# Patient Record
Sex: Female | Born: 2000 | Race: Black or African American | Hispanic: No | Marital: Single | Smoking: Never smoker
Health system: Southern US, Community
[De-identification: ages and names within clinical notes are randomized; demographics above are authoritative.]

---

## 2020-10-04 ENCOUNTER — Encounter (HOSPITAL_COMMUNITY): Payer: Self-pay | Admitting: Emergency Medicine

## 2020-10-04 ENCOUNTER — Emergency Department (HOSPITAL_COMMUNITY)
Admission: EM | Admit: 2020-10-04 | Discharge: 2020-10-04 | Disposition: A | Discharge status: Home / Self Care | Payer: Medicaid Other | Attending: Emergency Medicine | Admitting: Emergency Medicine

## 2020-10-04 ENCOUNTER — Other Ambulatory Visit: Payer: Self-pay

## 2020-10-04 ENCOUNTER — Emergency Department (HOSPITAL_COMMUNITY): Payer: Medicaid Other

## 2020-10-04 DIAGNOSIS — W3400XA Accidental discharge from unspecified firearms or gun, initial encounter: Secondary | ICD-10-CM | POA: Diagnosis not present

## 2020-10-04 DIAGNOSIS — S71131A Puncture wound without foreign body, right thigh, initial encounter: Secondary | ICD-10-CM

## 2020-10-04 DIAGNOSIS — Y92511 Restaurant or cafe as the place of occurrence of the external cause: Secondary | ICD-10-CM | POA: Insufficient documentation

## 2020-10-04 DIAGNOSIS — S71101A Unspecified open wound, right thigh, initial encounter: Secondary | ICD-10-CM | POA: Insufficient documentation

## 2020-10-04 DIAGNOSIS — S79921A Unspecified injury of right thigh, initial encounter: Secondary | ICD-10-CM | POA: Diagnosis present

## 2020-10-04 LAB — I-STAT BETA HCG BLOOD, ED (MC, WL, AP ONLY): I-stat hCG, quantitative: <5 m[IU]/mL (ref <5)
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
## 2020-10-04 NOTE — Progress Notes (Addendum)
Orthopedic Tech Progress Note Patient Details:  Brooke Benson 2001/05/28 [id number]  Ortho Devices Type of Ortho Device: Crutches Ortho Device/Splint Interventions: Ordered,Application,Adjustment  The patient states she already knows how to use crutches. I told the RN if upon discharge the patient has trouble to call us. Post Interventions Patient Tolerated: Well Instructions Provided: Care of device,Adjustment of device   Trinna Post 10/04/2020, 6:40 AM

## 2020-10-04 NOTE — Discharge Instructions (Signed)
Use crutches as needed.  Take acetaminophen and/or ibuprofen as needed for pain.  Please note that combining the 2 medications together gives you better pain relief than either 1 by itself.  Apply ice as needed.  You may be as active as pain allows you to be.
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
